# Patient Record
Sex: Female | Born: 1990 | Race: Black or African American | Hispanic: No | Marital: Single | Smoking: Smoker, current status unknown
Health system: Southern US, Community
[De-identification: ages and names within clinical notes are randomized; demographics above are authoritative.]

## PROBLEM LIST (undated history)

## (undated) DIAGNOSIS — Z4541 Encounter for adjustment and management of cerebrospinal fluid drainage device: Secondary | ICD-10-CM

## (undated) DIAGNOSIS — IMO0002 Reserved for concepts with insufficient information to code with codable children: Secondary | ICD-10-CM

## (undated) HISTORY — PX: VENTRICULAR ABLATION SURGERY: SHX835

---
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
## 2011-01-20 ENCOUNTER — Emergency Department (HOSPITAL_COMMUNITY)
Admission: EM | Admit: 2011-01-20 | Discharge: 2011-01-21 | Disposition: A | Discharge status: Home / Self Care | Payer: Medicaid Other | Attending: Emergency Medicine | Admitting: Emergency Medicine

## 2011-01-20 DIAGNOSIS — R51 Headache: Secondary | ICD-10-CM | POA: Insufficient documentation

## 2011-01-20 DIAGNOSIS — R110 Nausea: Secondary | ICD-10-CM | POA: Insufficient documentation

## 2011-02-08 LAB — URINALYSIS, ROUTINE W REFLEX MICROSCOPIC
Glucose, UA: NEGATIVE mg/dL
Hgb urine dipstick: NEGATIVE
Ketones, ur: NEGATIVE mg/dL
Protein, ur: NEGATIVE mg/dL

## 2011-02-08 LAB — WET PREP, GENITAL
Trich, Wet Prep: NONE SEEN
WBC, Wet Prep HPF POC: NONE SEEN

## 2011-02-10 LAB — COMPREHENSIVE METABOLIC PANEL
ALT: 16 U/L (ref 0–35)
AST: 25 U/L (ref 0–37)
Albumin: 3.7 g/dL (ref 3.5–5.2)
Alkaline Phosphatase: 51 U/L (ref 47–119)
BUN: 12 mg/dL (ref 6–23)
Chloride: 109 mEq/L (ref 96–112)
Potassium: 3.4 mEq/L — ABNORMAL LOW (ref 3.5–5.1)
Sodium: 137 mEq/L (ref 135–145)
Total Bilirubin: 0.5 mg/dL (ref 0.3–1.2)

## 2011-02-10 LAB — URINALYSIS, ROUTINE W REFLEX MICROSCOPIC
Bilirubin Urine: NEGATIVE
Bilirubin Urine: NEGATIVE
Ketones, ur: NEGATIVE mg/dL
Nitrite: NEGATIVE
Protein, ur: NEGATIVE mg/dL
Protein, ur: NEGATIVE mg/dL
Urobilinogen, UA: 0.2 mg/dL (ref 0.0–1.0)

## 2011-02-10 LAB — CBC
HCT: 37.2 % (ref 36.0–49.0)
MCV: 90.8 fL (ref 78.0–98.0)
Platelets: 239 10*3/uL (ref 150–400)
RBC: 4.09 MIL/uL (ref 3.80–5.70)
WBC: 9.1 10*3/uL (ref 4.5–13.5)

## 2011-02-10 LAB — DIFFERENTIAL
Basophils Absolute: 0 10*3/uL (ref 0.0–0.1)
Basophils Relative: 0 % (ref 0–1)
Eosinophils Absolute: 0.1 10*3/uL (ref 0.0–1.2)
Eosinophils Relative: 2 % (ref 0–5)
Monocytes Absolute: 0.6 10*3/uL (ref 0.2–1.2)
Neutro Abs: 6.1 10*3/uL (ref 1.7–8.0)

## 2011-02-10 LAB — PREGNANCY, URINE
Preg Test, Ur: NEGATIVE
Preg Test, Ur: NEGATIVE

## 2011-02-10 LAB — HERPES SIMPLEX VIRUS CULTURE: Culture: NOT DETECTED

## 2011-02-10 LAB — URINE MICROSCOPIC-ADD ON

## 2011-02-11 LAB — PREGNANCY, URINE: Preg Test, Ur: NEGATIVE

## 2011-02-12 LAB — URINALYSIS, ROUTINE W REFLEX MICROSCOPIC
Bilirubin Urine: NEGATIVE
Ketones, ur: NEGATIVE mg/dL
Nitrite: NEGATIVE
Specific Gravity, Urine: 1.02 (ref 1.005–1.030)
Urobilinogen, UA: 0.2 mg/dL (ref 0.0–1.0)
pH: 7.5 (ref 5.0–8.0)

## 2011-02-12 LAB — PREGNANCY, URINE: Preg Test, Ur: NEGATIVE

## 2011-02-14 LAB — DIFFERENTIAL
Basophils Relative: 0 % (ref 0–1)
Basophils Relative: 0 % (ref 0–1)
Eosinophils Relative: 0 % (ref 0–5)
Lymphocytes Relative: 16 % — ABNORMAL LOW (ref 24–48)
Lymphocytes Relative: 31 % (ref 24–48)
Lymphs Abs: 3 10*3/uL (ref 1.1–4.8)
Monocytes Absolute: 0.4 10*3/uL (ref 0.2–1.2)
Monocytes Absolute: 0.9 10*3/uL (ref 0.2–1.2)
Monocytes Relative: 10 % (ref 3–11)
Monocytes Relative: 4 % (ref 3–11)
Neutro Abs: 5.3 10*3/uL (ref 1.7–8.0)
Neutro Abs: 8 10*3/uL (ref 1.7–8.0)
Neutrophils Relative %: 55 % (ref 43–71)

## 2011-02-14 LAB — URINALYSIS, ROUTINE W REFLEX MICROSCOPIC
Bilirubin Urine: NEGATIVE
Glucose, UA: NEGATIVE mg/dL
Ketones, ur: NEGATIVE mg/dL
Protein, ur: NEGATIVE mg/dL
pH: 7.5 (ref 5.0–8.0)

## 2011-02-14 LAB — COMPREHENSIVE METABOLIC PANEL
Albumin: 3.4 g/dL — ABNORMAL LOW (ref 3.5–5.2)
Alkaline Phosphatase: 67 U/L (ref 47–119)
BUN: 12 mg/dL (ref 6–23)
Calcium: 9.1 mg/dL (ref 8.4–10.5)
Creatinine, Ser: 0.74 mg/dL (ref 0.4–1.2)
Glucose, Bld: 75 mg/dL (ref 70–99)
Total Protein: 6.8 g/dL (ref 6.0–8.3)

## 2011-02-14 LAB — CBC
HCT: 37.8 % (ref 36.0–49.0)
HCT: 40 % (ref 36.0–49.0)
Hemoglobin: 12.9 g/dL (ref 12.0–16.0)
Hemoglobin: 13.6 g/dL (ref 12.0–16.0)
MCHC: 34.1 g/dL (ref 31.0–37.0)
MCHC: 34.2 g/dL (ref 31.0–37.0)
Platelets: 229 10*3/uL (ref 150–400)
RBC: 4.47 MIL/uL (ref 3.80–5.70)
RDW: 13.3 % (ref 11.4–15.5)

## 2011-02-14 LAB — BASIC METABOLIC PANEL
CO2: 26 mEq/L (ref 19–32)
Calcium: 9.6 mg/dL (ref 8.4–10.5)
Glucose, Bld: 125 mg/dL — ABNORMAL HIGH (ref 70–99)
Potassium: 3.7 mEq/L (ref 3.5–5.1)
Sodium: 137 mEq/L (ref 135–145)

## 2011-02-14 LAB — PREGNANCY, URINE
Preg Test, Ur: NEGATIVE
Preg Test, Ur: NEGATIVE

## 2011-02-16 LAB — URINALYSIS, ROUTINE W REFLEX MICROSCOPIC
Bilirubin Urine: NEGATIVE
Glucose, UA: NEGATIVE mg/dL
Nitrite: NEGATIVE
Specific Gravity, Urine: 1.025 (ref 1.005–1.030)
pH: 7.5 (ref 5.0–8.0)

## 2011-02-16 LAB — PREGNANCY, URINE: Preg Test, Ur: NEGATIVE

## 2011-02-16 LAB — BASIC METABOLIC PANEL
Chloride: 105 mEq/L (ref 96–112)
Potassium: 3.7 mEq/L (ref 3.5–5.1)
Sodium: 138 mEq/L (ref 135–145)

## 2011-02-16 LAB — CBC
HCT: 39.4 % (ref 36.0–49.0)
Hemoglobin: 13.6 g/dL (ref 12.0–16.0)
MCV: 90.4 fL (ref 78.0–98.0)
RBC: 4.36 MIL/uL (ref 3.80–5.70)
WBC: 8.4 10*3/uL (ref 4.5–13.5)

## 2011-02-16 LAB — DIFFERENTIAL
Eosinophils Relative: 4 % (ref 0–5)
Lymphocytes Relative: 29 % (ref 24–48)
Lymphs Abs: 2.4 10*3/uL (ref 1.1–4.8)
Monocytes Absolute: 0.8 10*3/uL (ref 0.2–1.2)
Monocytes Relative: 10 % (ref 3–11)

## 2011-02-28 LAB — DIFFERENTIAL
Basophils Relative: 0 % (ref 0–1)
Eosinophils Absolute: 0.1 10*3/uL (ref 0.0–1.2)
Eosinophils Relative: 2 % (ref 0–5)
Monocytes Absolute: 0.6 10*3/uL (ref 0.2–1.2)
Monocytes Relative: 8 % (ref 3–11)

## 2011-02-28 LAB — CBC
Hemoglobin: 11.5 g/dL — ABNORMAL LOW (ref 12.0–16.0)
MCHC: 34.1 g/dL (ref 31.0–37.0)
MCV: 91.3 fL (ref 78.0–98.0)
RBC: 3.71 MIL/uL — ABNORMAL LOW (ref 3.80–5.70)

## 2011-02-28 LAB — BASIC METABOLIC PANEL
CO2: 27 mEq/L (ref 19–32)
Chloride: 112 mEq/L (ref 96–112)
Sodium: 143 mEq/L (ref 135–145)

## 2011-02-28 LAB — URINALYSIS, ROUTINE W REFLEX MICROSCOPIC
Bilirubin Urine: NEGATIVE
Glucose, UA: NEGATIVE mg/dL
Hgb urine dipstick: NEGATIVE
Protein, ur: NEGATIVE mg/dL
Specific Gravity, Urine: 1.015 (ref 1.005–1.030)

## 2011-02-28 LAB — RAPID URINE DRUG SCREEN, HOSP PERFORMED
Amphetamines: NOT DETECTED
Barbiturates: NOT DETECTED
Cocaine: NOT DETECTED
Opiates: NOT DETECTED

## 2011-03-01 LAB — GC/CHLAMYDIA PROBE AMP, GENITAL
Chlamydia, DNA Probe: NEGATIVE
GC Probe Amp, Genital: NEGATIVE

## 2011-03-01 LAB — HERPES SIMPLEX VIRUS CULTURE: Culture: DETECTED

## 2011-03-04 LAB — DIFFERENTIAL
Basophils Absolute: 0 10*3/uL (ref 0.0–0.1)
Basophils Relative: 0 % (ref 0–1)
Neutro Abs: 6.1 10*3/uL (ref 1.7–8.0)
Neutrophils Relative %: 74 % — ABNORMAL HIGH (ref 43–71)

## 2011-03-04 LAB — URINALYSIS, ROUTINE W REFLEX MICROSCOPIC
Glucose, UA: NEGATIVE mg/dL
Ketones, ur: 15 mg/dL — AB

## 2011-03-04 LAB — CBC
MCHC: 34.8 g/dL (ref 31.0–37.0)
Platelets: 235 10*3/uL (ref 150–400)
RBC: 4.09 MIL/uL (ref 3.80–5.70)
RDW: 14 % (ref 11.4–15.5)

## 2011-03-04 LAB — BASIC METABOLIC PANEL
BUN: 10 mg/dL (ref 6–23)
CO2: 27 mEq/L (ref 19–32)
Calcium: 9.3 mg/dL (ref 8.4–10.5)
Creatinine, Ser: 0.81 mg/dL (ref 0.4–1.2)

## 2011-03-04 LAB — GC/CHLAMYDIA PROBE AMP, GENITAL: GC Probe Amp, Genital: NEGATIVE

## 2011-03-04 LAB — URINE MICROSCOPIC-ADD ON

## 2011-03-06 LAB — PREGNANCY, URINE: Preg Test, Ur: NEGATIVE

## 2011-03-06 LAB — BASIC METABOLIC PANEL
BUN: 2 mg/dL — ABNORMAL LOW (ref 6–23)
BUN: 5 mg/dL — ABNORMAL LOW (ref 6–23)
Calcium: 8.7 mg/dL (ref 8.4–10.5)
Calcium: 9.2 mg/dL (ref 8.4–10.5)
Chloride: 106 mEq/L (ref 96–112)
Chloride: 109 mEq/L (ref 96–112)
Creatinine, Ser: 0.54 mg/dL (ref 0.4–1.2)
Creatinine, Ser: 0.65 mg/dL (ref 0.4–1.2)
Potassium: 3.5 mEq/L (ref 3.5–5.1)
Potassium: 3.9 mEq/L (ref 3.5–5.1)
Sodium: 135 mEq/L (ref 135–145)

## 2011-03-06 LAB — DIFFERENTIAL
Basophils Absolute: 0 10*3/uL (ref 0.0–0.1)
Basophils Relative: 0 % (ref 0–1)
Eosinophils Absolute: 0.1 10*3/uL (ref 0.0–1.2)
Eosinophils Relative: 0 % (ref 0–5)
Lymphocytes Relative: 15 % — ABNORMAL LOW (ref 24–48)
Lymphocytes Relative: 3 % — ABNORMAL LOW (ref 24–48)
Lymphs Abs: 0.3 10*3/uL — ABNORMAL LOW (ref 1.1–4.8)
Lymphs Abs: 1 10*3/uL — ABNORMAL LOW (ref 1.1–4.8)
Lymphs Abs: 1.6 10*3/uL (ref 1.1–4.8)
Monocytes Absolute: 0.1 10*3/uL — ABNORMAL LOW (ref 0.2–1.2)
Monocytes Relative: 13 % — ABNORMAL HIGH (ref 3–11)
Neutro Abs: 11.7 10*3/uL — ABNORMAL HIGH (ref 1.7–8.0)
Neutro Abs: 5.5 10*3/uL (ref 1.7–8.0)
Neutrophils Relative %: 69 % (ref 43–71)
Neutrophils Relative %: 72 % — ABNORMAL HIGH (ref 43–71)

## 2011-03-06 LAB — URINALYSIS, ROUTINE W REFLEX MICROSCOPIC
Bilirubin Urine: NEGATIVE
Glucose, UA: NEGATIVE mg/dL
Ketones, ur: NEGATIVE mg/dL
Leukocytes, UA: NEGATIVE
Nitrite: NEGATIVE
Protein, ur: 100 mg/dL — AB
Specific Gravity, Urine: 1.01 (ref 1.005–1.030)
Specific Gravity, Urine: 1.02 (ref 1.005–1.030)
Urobilinogen, UA: 0.2 mg/dL (ref 0.0–1.0)
pH: 5.5 (ref 5.0–8.0)

## 2011-03-06 LAB — CBC
HCT: 34.4 % — ABNORMAL LOW (ref 36.0–49.0)
Hemoglobin: 12.2 g/dL (ref 12.0–16.0)
MCV: 89 fL (ref 78.0–98.0)
Platelets: 197 10*3/uL (ref 150–400)
Platelets: 213 10*3/uL (ref 150–400)
RBC: 3.42 MIL/uL — ABNORMAL LOW (ref 3.80–5.70)
RDW: 13.2 % (ref 11.4–15.5)
WBC: 10.5 10*3/uL (ref 4.5–13.5)
WBC: 12.1 10*3/uL (ref 4.5–13.5)
WBC: 7.6 10*3/uL (ref 4.5–13.5)

## 2011-03-06 LAB — CULTURE, BLOOD (ROUTINE X 2)
Culture: NO GROWTH
Culture: NO GROWTH
Report Status: 5272010
Report Status: 5272010

## 2011-03-06 LAB — URINE MICROSCOPIC-ADD ON

## 2011-03-07 LAB — DIFFERENTIAL
Lymphocytes Relative: 14 % — ABNORMAL LOW (ref 24–48)
Monocytes Absolute: 0.5 10*3/uL (ref 0.2–1.2)
Monocytes Relative: 7 % (ref 3–11)
Neutro Abs: 5 10*3/uL (ref 1.7–8.0)

## 2011-03-07 LAB — URINALYSIS, ROUTINE W REFLEX MICROSCOPIC
Glucose, UA: NEGATIVE mg/dL
Specific Gravity, Urine: 1.025 (ref 1.005–1.030)
pH: 6 (ref 5.0–8.0)

## 2011-03-07 LAB — BASIC METABOLIC PANEL
CO2: 26 mEq/L (ref 19–32)
Calcium: 9.3 mg/dL (ref 8.4–10.5)
Sodium: 137 mEq/L (ref 135–145)

## 2011-03-07 LAB — URINE MICROSCOPIC-ADD ON

## 2011-03-07 LAB — CBC
Hemoglobin: 12.5 g/dL (ref 12.0–16.0)
RBC: 4.03 MIL/uL (ref 3.80–5.70)

## 2011-03-12 LAB — URINALYSIS, ROUTINE W REFLEX MICROSCOPIC
Bilirubin Urine: NEGATIVE
Ketones, ur: NEGATIVE mg/dL
Nitrite: NEGATIVE
Urobilinogen, UA: 0.2 mg/dL (ref 0.0–1.0)
pH: 5.5 (ref 5.0–8.0)

## 2011-03-12 LAB — CULTURE, BLOOD (ROUTINE X 2)

## 2011-03-12 LAB — CBC
Hemoglobin: 13.5 g/dL (ref 12.0–16.0)
Platelets: 320 10*3/uL (ref 150–400)
RDW: 12.9 % (ref 11.4–15.5)
WBC: 8.7 10*3/uL (ref 4.5–13.5)

## 2011-03-12 LAB — BASIC METABOLIC PANEL
BUN: 6 mg/dL (ref 6–23)
Calcium: 9.4 mg/dL (ref 8.4–10.5)
Chloride: 104 mEq/L (ref 96–112)
Glucose, Bld: 97 mg/dL (ref 70–99)

## 2011-03-12 LAB — PROTIME-INR: INR: 1 (ref 0.00–1.49)

## 2011-03-12 LAB — APTT: aPTT: 29 seconds (ref 24–37)

## 2011-03-12 LAB — DIFFERENTIAL
Basophils Absolute: 0.1 10*3/uL (ref 0.0–0.1)
Lymphocytes Relative: 27 % (ref 24–48)
Lymphs Abs: 2.3 10*3/uL (ref 1.1–4.8)
Neutro Abs: 5.3 10*3/uL (ref 1.7–8.0)

## 2011-03-12 LAB — PREGNANCY, URINE: Preg Test, Ur: NEGATIVE

## 2011-03-12 LAB — SEDIMENTATION RATE: Sed Rate: 10 mm/hr (ref 0–22)

## 2011-03-12 LAB — URINE CULTURE: Colony Count: 15000

## 2011-03-31 ENCOUNTER — Emergency Department (HOSPITAL_COMMUNITY)
Admission: EM | Admit: 2011-03-31 | Discharge: 2011-04-01 | Disposition: A | Discharge status: Home / Self Care | Payer: Medicaid Other | Attending: Emergency Medicine | Admitting: Emergency Medicine

## 2011-03-31 DIAGNOSIS — A6000 Herpesviral infection of urogenital system, unspecified: Secondary | ICD-10-CM | POA: Insufficient documentation

## 2011-03-31 DIAGNOSIS — R110 Nausea: Secondary | ICD-10-CM | POA: Insufficient documentation

## 2011-03-31 DIAGNOSIS — R51 Headache: Secondary | ICD-10-CM | POA: Insufficient documentation

## 2011-03-31 DIAGNOSIS — H53149 Visual discomfort, unspecified: Secondary | ICD-10-CM | POA: Insufficient documentation

## 2011-04-08 ENCOUNTER — Emergency Department (HOSPITAL_COMMUNITY)
Admission: EM | Admit: 2011-04-08 | Discharge: 2011-04-09 | Disposition: A | Discharge status: Home / Self Care | Payer: Medicaid Other | Attending: Emergency Medicine | Admitting: Emergency Medicine

## 2011-04-08 DIAGNOSIS — K5289 Other specified noninfective gastroenteritis and colitis: Secondary | ICD-10-CM | POA: Insufficient documentation

## 2011-04-08 DIAGNOSIS — R112 Nausea with vomiting, unspecified: Secondary | ICD-10-CM | POA: Insufficient documentation

## 2011-04-08 DIAGNOSIS — R109 Unspecified abdominal pain: Secondary | ICD-10-CM | POA: Insufficient documentation

## 2011-04-08 DIAGNOSIS — Z982 Presence of cerebrospinal fluid drainage device: Secondary | ICD-10-CM | POA: Insufficient documentation

## 2011-04-08 DIAGNOSIS — R3915 Urgency of urination: Secondary | ICD-10-CM | POA: Insufficient documentation

## 2011-04-08 DIAGNOSIS — F17200 Nicotine dependence, unspecified, uncomplicated: Secondary | ICD-10-CM | POA: Insufficient documentation

## 2011-04-08 LAB — DIFFERENTIAL
Basophils Absolute: 0 10*3/uL (ref 0.0–0.1)
Basophils Relative: 0 % (ref 0–1)
Eosinophils Absolute: 0.2 10*3/uL (ref 0.0–0.7)
Eosinophils Relative: 2 % (ref 0–5)
Monocytes Absolute: 0.7 10*3/uL (ref 0.1–1.0)

## 2011-04-08 LAB — CBC
HCT: 40.2 % (ref 36.0–46.0)
MCHC: 33.3 g/dL (ref 30.0–36.0)
Platelets: 237 10*3/uL (ref 150–400)
RDW: 13 % (ref 11.5–15.5)

## 2011-04-08 LAB — POCT PREGNANCY, URINE: Preg Test, Ur: NEGATIVE

## 2011-04-09 LAB — COMPREHENSIVE METABOLIC PANEL
AST: 11 U/L (ref 0–37)
CO2: 27 mEq/L (ref 19–32)
Calcium: 10.3 mg/dL (ref 8.4–10.5)
Creatinine, Ser: 0.68 mg/dL (ref 0.4–1.2)
GFR calc Af Amer: >60 mL/min (ref >60)
GFR calc non Af Amer: >60 mL/min (ref >60)

## 2011-04-09 LAB — WET PREP, GENITAL
Clue Cells Wet Prep HPF POC: NONE SEEN
Trich, Wet Prep: NONE SEEN

## 2011-04-09 LAB — URINALYSIS, ROUTINE W REFLEX MICROSCOPIC
Hgb urine dipstick: NEGATIVE
Nitrite: NEGATIVE
Protein, ur: NEGATIVE mg/dL
Urobilinogen, UA: 0.2 mg/dL (ref 0.0–1.0)

## 2011-04-09 LAB — LIPASE, BLOOD: Lipase: 29 U/L (ref 11–59)

## 2011-04-10 LAB — GC/CHLAMYDIA PROBE AMP, GENITAL: GC Probe Amp, Genital: NEGATIVE

## 2011-04-10 NOTE — H&P (Signed)
[name], [name]           ACCOUNT NO.:  [id number]   MEDICAL RECORD NO.:  [id number]          PATIENT TYPE:  INP   LOCATION:  A321                          FACILITY:  [facility]   PHYSICIAN:  Scott A. Gerda Diss, MD    DATE OF BIRTH:  01-25-1991   DATE OF ADMISSION:  04/16/2009  DATE OF DISCHARGE:  05/25/2010LH                              HISTORY & PHYSICAL   CHIEF COMPLAINT:  Fever and abdominal discomfort.   HISTORY OF PRESENT ILLNESS:  This 20 year old female relates some  intermittent abdominal pain over the past few days, also relates some  back pain and discomfort.  It should be noted that she denies any severe  headache.  She states that she started noticing the back pain a few days  ago and it has become worse and then today she has noticed chills and  fever, along with abdominal discomfort and a couple episodes of  vomiting.  She denies any cough.  She denies any diarrhea.  She states  her overall energy level is fair and it should be noted that she had a  shunt revision last month at [facility].   PAST MEDICAL HISTORY:  The patient states she has had a shunt ever since  birth.   SOCIAL HISTORY:  Smokes.  Does use of marijuana, also birth control  pills.   ALLERGIES:  None.   REVIEW OF SYSTEMS:  Per above.   PHYSICAL EXAMINATION:  GENERAL:  NAD.  HEENT:  TMs,  L, T, and MM.  NECK:  Supple.  CHEST:  CTA.  No crackles.  HEART:  Regular.  ABDOMEN:  Soft with generalized soreness, but on no guarding and a  little more tenderness in the lower abdomen and on the back.  She is  tender in the right flank region.   LABORATORY DATA:  It should be noted that her white count is at 12,000  with a left shift.  Also noted that potassium is 3.5.  Urinalysis shows  1.020 with large amount hemoglobin and large amount of leukocyte  esterase and on microscopic wbc's TNTC, rbc's TNTC.  Urine pregnancy  negative.   ASSESSMENT AND PLAN:  Pyelonephritis - I really do not feel that this  is  an infection of her shunt.  I think this is a urinary tract infection  has gone into of pyelonephritis.  I would recommend the Rocephin on a  regular basis 1 g  daily,  also pain medicine and nausea medicine as necessary IV fluids,  monitor closely with her neck being supple and no severe headache, it is  really doubtful this is meningitis or shunt problems.  We will do a  renal ultrasound on Monday and follow the patient closely.      Scott A. Gerda Diss, MD  Electronically Signed     SAL/MEDQ  D:  04/16/2009  T:  04/17/2009  Job:  [id number]

## 2011-04-10 NOTE — Discharge Summary (Signed)
NAMECAYLAN, CHENARD           ACCOUNT NO.:  [id number]   MEDICAL RECORD NO.:  [id number]          PATIENT TYPE:  INP   LOCATION:  A321                          FACILITY:  [facility]   PHYSICIAN:  Scott A. Gerda Diss, MD    DATE OF BIRTH:  1991/02/10   DATE OF ADMISSION:  04/16/2009  DATE OF DISCHARGE:  05/25/2010LH                               DISCHARGE SUMMARY   DISCHARGE DIAGNOSES:  1. Pyelonephritis.  2. Headache.  3. Possible shunt dysfunction.   HOSPITAL COURSE:  The patient was admitted in after having a few days  history of feeling bad, headache, nausea, abdominal pains and back pain.  She denied any severe headache and denied any excessive urination.  She  states energy level fair and had a shunt revision last month at [facility].  She denies any fevers.  On exam, she had flank tenderness.  Neck was  supple.  Lungs were clear.  Urine showed a large number of WBCs and  RBCs.  Unfortunately, urine culture was not done as thought.  A renal  ultrasound was negative for an abscess.  A CBC repeat showed a white  count of 7.6.  Repeat urinalysis on the Apr 18, 2009, showed 0-2 WBCs  per [facility] and a renal ultrasound was negative.  Because the patient  continued to have headache, back pain and not feeling good and even some  nausea and occasional vomiting, it is felt possibility of shunt problems  as being another source since it appears clinically that the urine  problem has improved, so I did speak with Dr. Caralyn Guile, who spoke  with the neurosurgeon and they agreed to take the patient to [facility]  for further evaluation of the shunt and so the patient was transferred  in good condition.      Scott A. Gerda Diss, MD  Electronically Signed     SAL/MEDQ  D:  04/19/2009  T:  04/19/2009  Job:  [id number]

## 2011-04-14 ENCOUNTER — Emergency Department (HOSPITAL_COMMUNITY): Payer: Medicaid Other

## 2011-04-14 ENCOUNTER — Emergency Department (HOSPITAL_COMMUNITY)
Admission: EM | Admit: 2011-04-14 | Discharge: 2011-04-15 | Disposition: A | Discharge status: Home / Self Care | Payer: Medicaid Other | Attending: Emergency Medicine | Admitting: Emergency Medicine

## 2011-04-14 DIAGNOSIS — R51 Headache: Secondary | ICD-10-CM | POA: Insufficient documentation

## 2011-04-14 DIAGNOSIS — S0510XA Contusion of eyeball and orbital tissues, unspecified eye, initial encounter: Secondary | ICD-10-CM | POA: Insufficient documentation

## 2011-04-14 DIAGNOSIS — M542 Cervicalgia: Secondary | ICD-10-CM | POA: Insufficient documentation

## 2011-04-14 DIAGNOSIS — H5789 Other specified disorders of eye and adnexa: Secondary | ICD-10-CM | POA: Insufficient documentation

## 2011-04-14 DIAGNOSIS — M549 Dorsalgia, unspecified: Secondary | ICD-10-CM | POA: Insufficient documentation

## 2011-04-14 DIAGNOSIS — S1093XA Contusion of unspecified part of neck, initial encounter: Secondary | ICD-10-CM | POA: Insufficient documentation

## 2011-04-14 DIAGNOSIS — S0003XA Contusion of scalp, initial encounter: Secondary | ICD-10-CM | POA: Insufficient documentation

## 2011-04-30 ENCOUNTER — Emergency Department (HOSPITAL_COMMUNITY)
Admission: EM | Admit: 2011-04-30 | Discharge: 2011-04-30 | Disposition: A | Discharge status: Home / Self Care | Payer: Medicaid Other | Attending: Emergency Medicine | Admitting: Emergency Medicine

## 2011-04-30 DIAGNOSIS — N898 Other specified noninflammatory disorders of vagina: Secondary | ICD-10-CM | POA: Insufficient documentation

## 2011-04-30 DIAGNOSIS — F17200 Nicotine dependence, unspecified, uncomplicated: Secondary | ICD-10-CM | POA: Insufficient documentation

## 2011-04-30 LAB — CBC
HCT: 38.9 % (ref 36.0–46.0)
MCH: 30 pg (ref 26.0–34.0)
MCHC: 32.9 g/dL (ref 30.0–36.0)
MCV: 91.3 fL (ref 78.0–100.0)
RDW: 13 % (ref 11.5–15.5)

## 2011-04-30 LAB — URINALYSIS, ROUTINE W REFLEX MICROSCOPIC
Nitrite: NEGATIVE
Specific Gravity, Urine: 1.015 (ref 1.005–1.030)
Urobilinogen, UA: 1 mg/dL (ref 0.0–1.0)

## 2011-04-30 LAB — DIFFERENTIAL
Eosinophils Relative: 3 % (ref 0–5)
Lymphocytes Relative: 19 % (ref 12–46)
Lymphs Abs: 2 10*3/uL (ref 0.7–4.0)
Monocytes Absolute: 0.9 10*3/uL (ref 0.1–1.0)

## 2011-04-30 LAB — WET PREP, GENITAL: WBC, Wet Prep HPF POC: NONE SEEN

## 2011-04-30 LAB — POCT PREGNANCY, URINE: Preg Test, Ur: NEGATIVE

## 2011-04-30 LAB — URINE MICROSCOPIC-ADD ON

## 2011-05-01 LAB — BASIC METABOLIC PANEL
CO2: 27 mEq/L (ref 19–32)
Chloride: 103 mEq/L (ref 96–112)
Creatinine, Ser: 0.76 mg/dL (ref 0.4–1.2)
GFR calc Af Amer: >60 mL/min (ref >60)
Potassium: 3.7 mEq/L (ref 3.5–5.1)

## 2011-05-02 LAB — GC/CHLAMYDIA PROBE AMP, GENITAL
Chlamydia, DNA Probe: NEGATIVE
GC Probe Amp, Genital: NEGATIVE

## 2011-05-07 IMAGING — CR DG CHEST 2V
2 series · 2 of 2 positions shown · non-contrast
Comparison: 03/04/2009

CLINICAL DATA: Cough and congestion

CHEST - 2 VIEW

[view not recorded (1 of 2)]
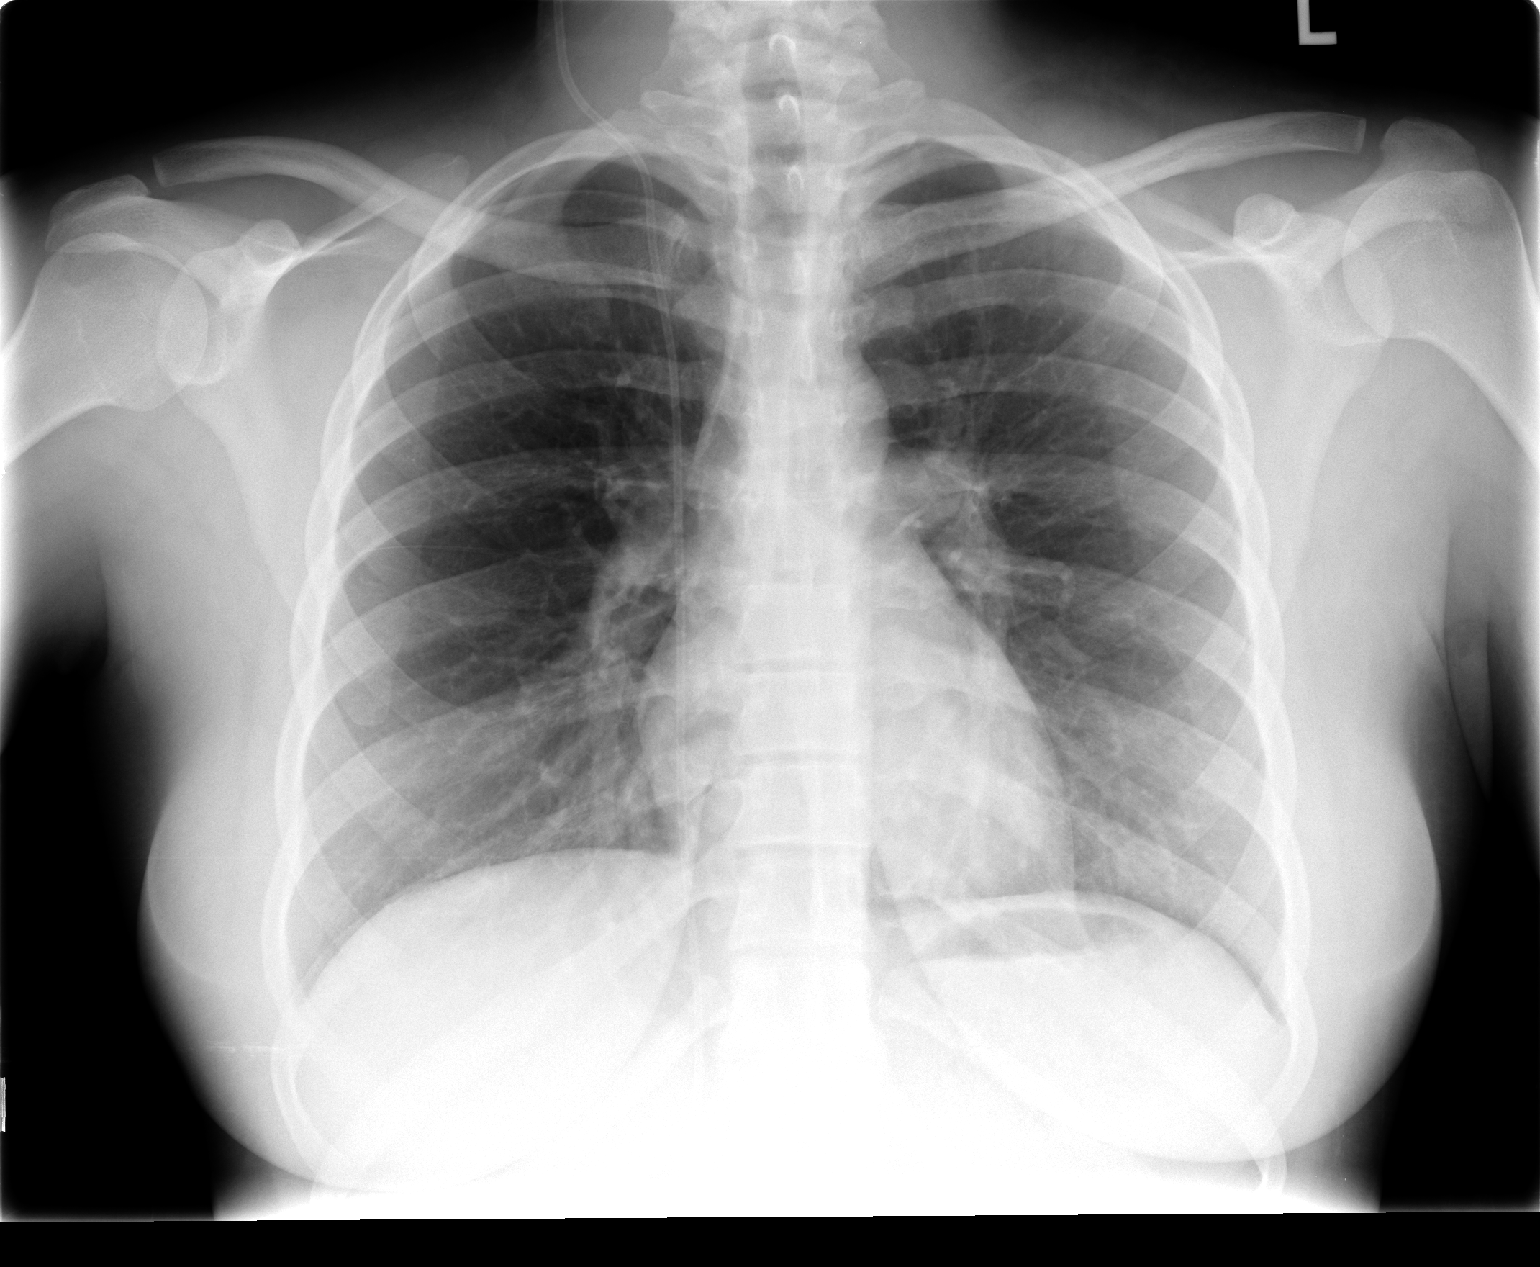

[view not recorded (2 of 2)]
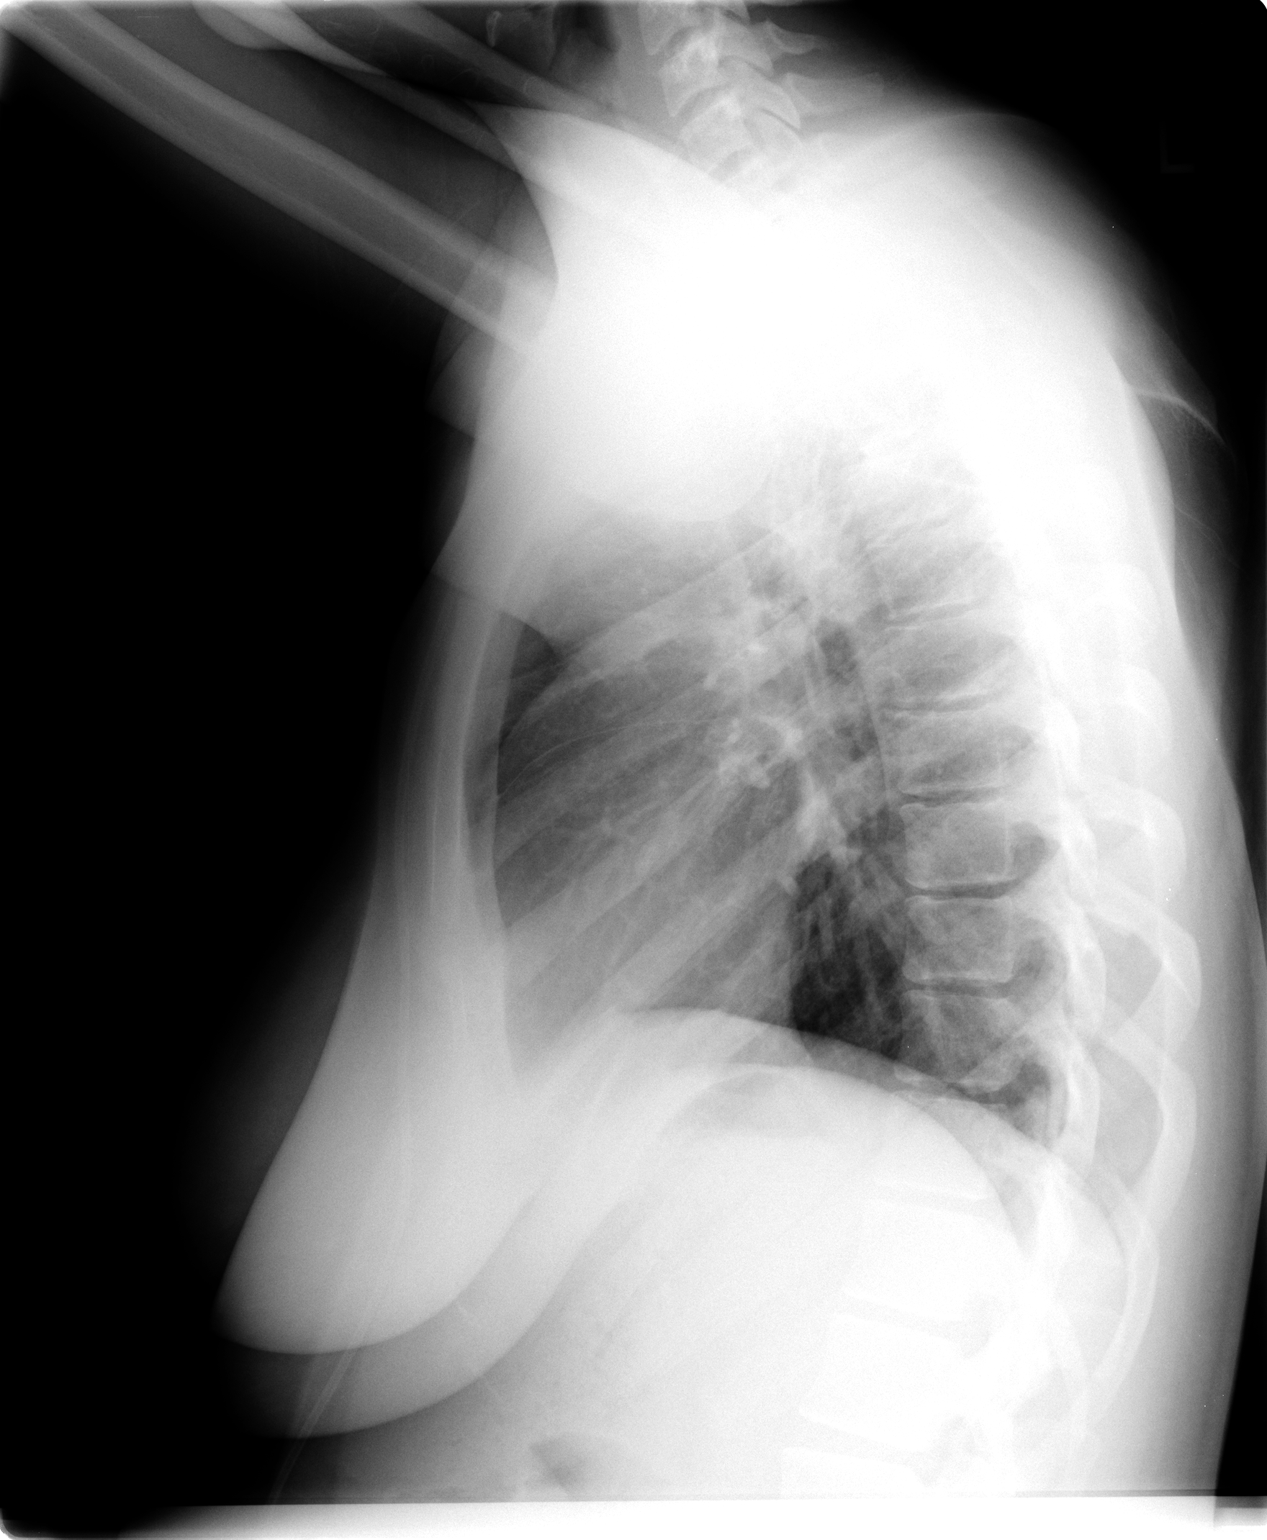

[2 of 2 positions shown; findings below may reference images not displayed]

FINDINGS: VP shunt tubing is partially seen over the right
hemithorax. Lungs clear.  Heart size and pulmonary vascularity
normal.  No effusion.  Visualized bones unremarkable.
IMPRESSION: No acute disease

## 2011-06-08 ENCOUNTER — Emergency Department (HOSPITAL_COMMUNITY)
Admission: EM | Admit: 2011-06-08 | Discharge: 2011-06-08 | Disposition: A | Discharge status: Home / Self Care | Payer: Medicaid Other | Attending: Emergency Medicine | Admitting: Emergency Medicine

## 2011-06-08 ENCOUNTER — Encounter: Payer: Self-pay | Admitting: Emergency Medicine

## 2011-06-08 DIAGNOSIS — R21 Rash and other nonspecific skin eruption: Secondary | ICD-10-CM

## 2011-06-08 DIAGNOSIS — F17200 Nicotine dependence, unspecified, uncomplicated: Secondary | ICD-10-CM | POA: Insufficient documentation

## 2011-06-08 HISTORY — DX: Reserved for concepts with insufficient information to code with codable children: IMO0002

## 2011-06-08 MED ORDER — HYDROXYZINE PAMOATE 100 MG PO CAPS: ORAL_CAPSULE | ORAL | Status: DC

## 2011-06-08 MED ORDER — TRIAMCINOLONE ACETONIDE 0.1 % EX CREA: TOPICAL_CREAM | Freq: Three times a day (TID) | CUTANEOUS | Status: AC

## 2011-06-08 MED ORDER — FEXOFENADINE HCL 180 MG PO TABS: ORAL_TABLET | ORAL | Status: AC

## 2011-06-08 NOTE — ED Provider Notes (Signed)
History     Chief Complaint  Patient presents with  . Rash   Patient is a 20 y.o. female presenting with rash. The history is provided by the patient.  Rash  This is a new problem. The current episode started more than 2 days ago. The problem has been gradually worsening. The problem is associated with an unknown factor. There has been no fever. The rash is present on the left arm. The patient is experiencing no pain. She has tried anti-itch cream for the symptoms. The treatment provided no relief.    Past Medical History  Diagnosis Date  . Hx of ventricular shunt     Past Surgical History  Procedure Date  . Ventricular ablation surgery     History reviewed. No pertinent family history.  History  Substance Use Topics  . Smoking status: Current Everyday Smoker    Types: Cigarettes  . Smokeless tobacco: Not on file  . Alcohol Use: Yes     occasional    OB History    Grav Para Term Preterm Abortions TAB SAB Ect Mult Living                  Review of Systems  Constitutional: Negative for activity change.       All ROS Neg except as noted in HPI  HENT: Negative for nosebleeds and neck pain.   Eyes: Negative for photophobia and discharge.  Respiratory: Negative for cough, shortness of breath and wheezing.   Cardiovascular: Negative for chest pain and palpitations.  Gastrointestinal: Negative for abdominal pain and blood in stool.  Genitourinary: Negative for dysuria, frequency and hematuria.  Musculoskeletal: Negative for back pain and arthralgias.  Skin: Positive for rash.  Neurological: Negative for dizziness, seizures and speech difficulty.  Psychiatric/Behavioral: Negative for hallucinations and confusion.    Physical Exam  BP 106/66  Pulse 81  Temp(Src) 98.9 F (37.2 C) (Oral)  Resp 19  SpO2 99%  LMP 04/27/2011  Physical Exam  Nursing note and vitals reviewed. Constitutional: She is oriented to person, place, and time. She appears well-developed and  well-nourished.  HENT:  Head: Normocephalic and atraumatic.  Eyes: EOM are normal. Pupils are equal, round, and reactive to light.  Neck: Normal range of motion. Neck supple.  Cardiovascular: Normal rate and normal heart sounds.   Pulmonary/Chest: Breath sounds normal. She has no wheezes.  Abdominal: Soft. Bowel sounds are normal.  Musculoskeletal: Normal range of motion.       6 raised red bumps of the lateral bicep/tricep area of the left arm. Some increase redness, but no streaking.  Neurological: She is alert and oriented to person, place, and time. No cranial nerve deficit.  Skin: Skin is warm and dry. Rash noted.    ED Course  Procedures  MDM I have reviewed nursing notes, vital signs, and all appropriate lab and imaging results for this patient.      Kathie Dike, [place] 06/08/11 [phone]

## 2011-06-08 NOTE — ED Notes (Signed)
L lateral arm rash with dime size circular red bumps noticed. C/o itching and burning. States bumps have gotten bigger since yeseterday. States she is a week late on her periods.

## 2011-06-08 NOTE — ED Notes (Signed)
Patient is resting comfortably. 

## 2011-06-12 NOTE — ED Provider Notes (Signed)
Medical screening examination/treatment/procedure(s) were conducted as a shared visit with non-physician practitioner(s) and myself.  I personally evaluated the patient during the encounter  [name] Dawn Cebastian Neis, MD 06/12/11 0759 

## 2011-07-08 ENCOUNTER — Emergency Department (HOSPITAL_COMMUNITY): Payer: Medicaid Other

## 2011-07-08 ENCOUNTER — Emergency Department (HOSPITAL_COMMUNITY)
Admission: EM | Admit: 2011-07-08 | Discharge: 2011-07-08 | Disposition: A | Discharge status: Home / Self Care | Payer: Medicaid Other | Attending: Emergency Medicine | Admitting: Emergency Medicine

## 2011-07-08 DIAGNOSIS — S0003XA Contusion of scalp, initial encounter: Secondary | ICD-10-CM | POA: Insufficient documentation

## 2011-07-08 DIAGNOSIS — IMO0002 Reserved for concepts with insufficient information to code with codable children: Secondary | ICD-10-CM | POA: Insufficient documentation

## 2011-07-08 DIAGNOSIS — R079 Chest pain, unspecified: Secondary | ICD-10-CM | POA: Insufficient documentation

## 2011-07-08 DIAGNOSIS — Y9229 Other specified public building as the place of occurrence of the external cause: Secondary | ICD-10-CM | POA: Insufficient documentation

## 2011-07-08 DIAGNOSIS — S0510XA Contusion of eyeball and orbital tissues, unspecified eye, initial encounter: Secondary | ICD-10-CM | POA: Insufficient documentation

## 2011-07-08 DIAGNOSIS — R51 Headache: Secondary | ICD-10-CM | POA: Insufficient documentation

## 2011-07-08 DIAGNOSIS — M549 Dorsalgia, unspecified: Secondary | ICD-10-CM | POA: Insufficient documentation

## 2011-07-08 DIAGNOSIS — N83209 Unspecified ovarian cyst, unspecified side: Secondary | ICD-10-CM | POA: Insufficient documentation

## 2011-07-08 DIAGNOSIS — R109 Unspecified abdominal pain: Secondary | ICD-10-CM | POA: Insufficient documentation

## 2011-07-08 DIAGNOSIS — R10819 Abdominal tenderness, unspecified site: Secondary | ICD-10-CM | POA: Insufficient documentation

## 2011-07-08 DIAGNOSIS — M542 Cervicalgia: Secondary | ICD-10-CM | POA: Insufficient documentation

## 2011-07-08 MED ORDER — IOHEXOL 300 MG/ML  SOLN
100.0000 mL | Freq: Once | INTRAMUSCULAR | Status: AC | PRN
  Administered 2011-07-08: 100 mL via INTRAVENOUS

## 2011-07-12 ENCOUNTER — Encounter: Payer: Self-pay | Admitting: *Deleted

## 2011-07-12 ENCOUNTER — Emergency Department (HOSPITAL_COMMUNITY)
Admission: EM | Admit: 2011-07-12 | Discharge: 2011-07-12 | Disposition: A | Discharge status: Home / Self Care | Payer: Self-pay | Attending: Emergency Medicine | Admitting: Emergency Medicine

## 2011-07-12 DIAGNOSIS — S39012A Strain of muscle, fascia and tendon of lower back, initial encounter: Secondary | ICD-10-CM

## 2011-07-12 DIAGNOSIS — R109 Unspecified abdominal pain: Secondary | ICD-10-CM | POA: Insufficient documentation

## 2011-07-12 DIAGNOSIS — S335XXA Sprain of ligaments of lumbar spine, initial encounter: Secondary | ICD-10-CM | POA: Insufficient documentation

## 2011-07-12 HISTORY — DX: Encounter for adjustment and management of cerebrospinal fluid drainage device: Z45.41

## 2011-07-12 MED ORDER — HYDROCODONE-ACETAMINOPHEN 5-325 MG PO TABS: 1.0000 | ORAL_TABLET | Freq: Four times a day (QID) | ORAL | Status: AC | PRN

## 2011-07-12 MED ORDER — OXYCODONE-ACETAMINOPHEN 5-325 MG PO TABS
1.0000 | ORAL_TABLET | Freq: Once | ORAL | Status: AC
  Administered 2011-07-12: 1 via ORAL
  Filled 2011-07-12: qty 1

## 2011-07-12 NOTE — ED Notes (Signed)
Pt taken out via wheel chair to her ride, pt stating no needs
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
## 2011-08-08 ENCOUNTER — Encounter: Payer: Self-pay | Admitting: Emergency Medicine

## 2011-08-21 LAB — STREP A DNA PROBE: Group A Strep Probe: NEGATIVE

## 2011-08-23 LAB — STREP A DNA PROBE

## 2011-08-27 LAB — POCT I-STAT, CHEM 8
Calcium, Ion: 1.25
Creatinine, Ser: 0.8
Glucose, Bld: 103 — ABNORMAL HIGH
HCT: 35
Hemoglobin: 11.9
Potassium: 3.5
TCO2: 25

## 2011-08-27 LAB — URINALYSIS, ROUTINE W REFLEX MICROSCOPIC
Nitrite: NEGATIVE
Specific Gravity, Urine: 1.015
Urobilinogen, UA: 2 — ABNORMAL HIGH
pH: 7.5

## 2011-08-27 LAB — URINE MICROSCOPIC-ADD ON

## 2011-08-27 LAB — STREP A DNA PROBE

## 2011-08-27 LAB — PREGNANCY, URINE: Preg Test, Ur: NEGATIVE

## 2014-11-14 ENCOUNTER — Encounter (HOSPITAL_COMMUNITY): Payer: Self-pay | Admitting: *Deleted

## 2014-11-14 ENCOUNTER — Emergency Department (HOSPITAL_COMMUNITY): Payer: Medicaid Other

## 2014-11-14 ENCOUNTER — Emergency Department (HOSPITAL_COMMUNITY)
Admission: EM | Admit: 2014-11-14 | Discharge: 2014-11-14 | Disposition: A | Discharge status: Home / Self Care | Payer: Medicaid Other | Attending: Emergency Medicine | Admitting: Emergency Medicine

## 2014-11-14 DIAGNOSIS — Y939 Activity, unspecified: Secondary | ICD-10-CM | POA: Insufficient documentation

## 2014-11-14 DIAGNOSIS — Z3202 Encounter for pregnancy test, result negative: Secondary | ICD-10-CM | POA: Insufficient documentation

## 2014-11-14 DIAGNOSIS — X58XXXA Exposure to other specified factors, initial encounter: Secondary | ICD-10-CM | POA: Insufficient documentation

## 2014-11-14 DIAGNOSIS — Z982 Presence of cerebrospinal fluid drainage device: Secondary | ICD-10-CM | POA: Insufficient documentation

## 2014-11-14 DIAGNOSIS — R51 Headache: Secondary | ICD-10-CM | POA: Insufficient documentation

## 2014-11-14 DIAGNOSIS — S93401A Sprain of unspecified ligament of right ankle, initial encounter: Secondary | ICD-10-CM

## 2014-11-14 DIAGNOSIS — Y929 Unspecified place or not applicable: Secondary | ICD-10-CM | POA: Insufficient documentation

## 2014-11-14 DIAGNOSIS — L509 Urticaria, unspecified: Secondary | ICD-10-CM | POA: Insufficient documentation

## 2014-11-14 DIAGNOSIS — M25571 Pain in right ankle and joints of right foot: Secondary | ICD-10-CM

## 2014-11-14 DIAGNOSIS — Y999 Unspecified external cause status: Secondary | ICD-10-CM | POA: Insufficient documentation

## 2014-11-14 LAB — URINALYSIS, ROUTINE W REFLEX MICROSCOPIC
GLUCOSE, UA: NEGATIVE mg/dL
HGB URINE DIPSTICK: NEGATIVE
Leukocytes, UA: NEGATIVE
Nitrite: NEGATIVE
PROTEIN: NEGATIVE mg/dL
UROBILINOGEN UA: 0.2 mg/dL (ref 0.0–1.0)
pH: 6 (ref 5.0–8.0)

## 2014-11-14 MED ORDER — HYDROXYZINE HCL 25 MG PO TABS: 25.0000 mg | ORAL_TABLET | Freq: Four times a day (QID) | ORAL | Status: DC

## 2014-11-14 MED ORDER — HYDROXYZINE HCL 50 MG/ML IM SOLN
50.0000 mg | Freq: Once | INTRAMUSCULAR | Status: AC
  Administered 2014-11-14: 50 mg via INTRAMUSCULAR
  Filled 2014-11-14: qty 1

## 2014-11-14 MED ORDER — FAMOTIDINE 20 MG PO TABS
20.0000 mg | ORAL_TABLET | Freq: Once | ORAL | Status: AC
  Administered 2014-11-14: 20 mg via ORAL
  Filled 2014-11-14: qty 1

## 2014-11-14 MED ORDER — PREDNISONE 50 MG PO TABS
60.0000 mg | ORAL_TABLET | Freq: Once | ORAL | Status: AC
  Administered 2014-11-14: 60 mg via ORAL
  Filled 2014-11-14 (×2): qty 1

## 2014-11-14 MED ORDER — HYDROXYZINE HCL 50 MG/ML IM SOLN
50.0000 mg | Freq: Once | INTRAMUSCULAR | Status: DC
  Filled 2014-11-14: qty 1

## 2014-11-14 MED ORDER — DIPHENHYDRAMINE HCL 25 MG PO CAPS
25.0000 mg | ORAL_CAPSULE | Freq: Once | ORAL | Status: AC
  Administered 2014-11-14: 25 mg via ORAL
  Filled 2014-11-14: qty 1

## 2014-11-14 NOTE — Discharge Instructions (Signed)
Ankle Sprain An ankle sprain is an injury to the strong, fibrous tissues (ligaments) that hold your ankle bones together.  HOME CARE   Put ice on your ankle for 1-2 days or as told by your doctor.  Put ice in a plastic bag.  Place a towel between your skin and the bag.  Leave the ice on for 15-20 minutes at a time, every 2 hours while you are awake.  Only take medicine as told by your doctor.  Raise (elevate) your injured ankle above the level of your heart as much as possible for 2-3 days.  Use crutches if your doctor tells you to. Slowly put your own weight on the affected ankle. Use the crutches until you can walk without pain.  If you have a plaster splint:  Do not rest it on anything harder than a pillow for 24 hours.  Do not put weight on it.  Do not get it wet.  Take it off to shower or bathe.  If given, use an elastic wrap or support stocking for support. Take the wrap off if your toes lose feeling (numb), tingle, or turn cold or blue.  If you have an air splint:  Add or let out air to make it comfortable.  Take it off at night and to shower and bathe.  Wiggle your toes and move your ankle up and down often while you are wearing it. GET HELP IF:  You have rapidly increasing bruising or puffiness (swelling).  Your toes feel very cold.  You lose feeling in your foot.  Your medicine does not help your pain. GET HELP RIGHT AWAY IF:   Your toes lose feeling (numb) or turn blue.  You have severe pain that is increasing. MAKE SURE YOU:   Understand these instructions.  Will watch your condition.  Will get help right away if you are not doing well or get worse. Document Released: 04/30/2008 Document Revised: 03/29/2014 Document Reviewed: 05/26/2012 [facility] Patient Information 2015 [place], [place]. This information is not intended to replace advice given to you by your health care provider. Make sure you discuss any questions you have with your health care  provider.  Hives Hives are itchy, red, swollen areas of the skin. They can vary in size and location on your body. Hives can come and go for hours or several days (acute hives) or for several weeks (chronic hives). Hives do not spread from person to person (noncontagious). They may get worse with scratching, exercise, and emotional stress. CAUSES   Allergic reaction to food, additives, or drugs.  Infections, including the common cold.  Illness, such as vasculitis, lupus, or thyroid disease.  Exposure to sunlight, heat, or cold.  Exercise.  Stress.  Contact with chemicals. SYMPTOMS   Red or white swollen patches on the skin. The patches may change size, shape, and location quickly and repeatedly.  Itching.  Swelling of the hands, feet, and face. This may occur if hives develop deeper in the skin. DIAGNOSIS  Your caregiver can usually tell what is wrong by performing a physical exam. Skin or blood tests may also be done to determine the cause of your hives. In some cases, the cause cannot be determined. TREATMENT  Mild cases usually get better with medicines such as antihistamines. Severe cases may require an emergency epinephrine injection. If the cause of your hives is known, treatment includes avoiding that trigger.  HOME CARE INSTRUCTIONS   Avoid causes that trigger your hives.  Take antihistamines as  directed by your caregiver to reduce the severity of your hives. Non-sedating or low-sedating antihistamines are usually recommended. Do not drive while taking an antihistamine.  Take any other medicines prescribed for itching as directed by your caregiver.  Wear loose-fitting clothing.  Keep all follow-up appointments as directed by your caregiver. SEEK MEDICAL CARE IF:   You have persistent or severe itching that is not relieved with medicine.  You have painful or swollen joints. SEEK IMMEDIATE MEDICAL CARE IF:   You have a fever.  Your tongue or lips are  swollen.  You have trouble breathing or swallowing.  You feel tightness in the throat or chest.  You have abdominal pain. These problems may be the first sign of a life-threatening allergic reaction. Call your local emergency services (911 in [place] MAKE SURE YOU:   Understand these instructions.  Will watch your condition.  Will get help right away if you are not doing well or get worse. Document Released: 11/12/2005 Document Revised: 11/17/2013 Document Reviewed: 02/05/2012 [facility] CenterExitCare Patient Information 2015 [place]Care, [place]LLC. This information is not intended to replace advice given to you by your health care provider. Make sure you discuss any questions you have with your health care provider.

## 2014-11-14 NOTE — ED Provider Notes (Signed)
CSN: [id number]     Arrival date & time 11/14/14  [id number] History   First MD Initiated Contact with Patient 11/14/14 [phone]     Chief Complaint  Patient presents with  . multiple complaints.      (Consider location/radiation/quality/duration/timing/severity/associated sxs/prior Treatment) HPI Comments: Patient is a 23 year old female who presents to the emergency department with complaint of right ankle pain. The patient states that this is been going on for 2 days. She has pain that is worse when she stands or walks. The pain eases only slightly when she is at rest. She has tried Tylenol, without success. She's not had any previous operations or procedures involving the right ankle. She has no known bone disorders. His been no injury or trauma reported.  Patient states that she noted this morning that her eyes were swollen shut and she had red splotches on her arms that were itching a lot. Later during the morning she noted something on the upper portion of her buttocks in the lower portion of her back and had her boyfriend to check it and he to acknowledge that there were red area similar to the ones on her arms present. The patient denies any new medicines, foods, or environment. His been no recent change in trying sheets or detergents or clothing or linens. Patient presents to the emergency department for evaluation of these issues.  The history is provided by the patient.    Past Medical History  Diagnosis Date  . Hx of ventricular shunt   . Cerebral ventricular shunt fitting or adjustment    Past Surgical History  Procedure Laterality Date  . Ventricular ablation surgery     No family history on file. History  Substance Use Topics  . Smoking status: Smoker, Current Status Unknown    Types: Cigarettes  . Smokeless tobacco: Not on file  . Alcohol Use: No     Comment: occasional   OB History    No data available     Review of Systems  Constitutional: Negative for activity  change.       All ROS Neg except as noted in HPI  Eyes: Negative for photophobia and discharge.  Respiratory: Negative for cough, shortness of breath and wheezing.   Cardiovascular: Negative for chest pain and palpitations.  Gastrointestinal: Negative for abdominal pain and blood in stool.  Genitourinary: Negative for dysuria, frequency and hematuria.  Musculoskeletal: Positive for arthralgias. Negative for back pain and neck pain.  Skin: Positive for rash.  Neurological: Positive for headaches. Negative for dizziness, seizures and speech difficulty.  Psychiatric/Behavioral: Negative for hallucinations and confusion.      Allergies  Bee venom  Home Medications   Prior to Admission medications   Medication Sig Start Date End Date Taking? Authorizing Provider  fexofenadine (ALLEGRA) 180 MG tablet 1 tab po q AM for itching 06/08/11   Kathie DikeHobson M Shamari Lofquist, PA-C  hydrOXYzine (VISTARIL) 100 MG capsule 1 at hs for itching. 06/08/11   Kathie DikeHobson M Romero Letizia, PA-C  ibuprofen (ADVIL,MOTRIN) 800 MG tablet Take 800 mg by mouth every 6 (six) hours as needed. For pain     Historical Provider, MD   BP 109/69 mmHg  Temp(Src) 97.8 F (36.6 C) (Oral)  Resp 16  Ht 5\' 1"  (1.549 m)  Wt 150 lb (68.04 kg)  BMI 28.36 kg/m2  SpO2 87%  LMP 11/07/2014 Physical Exam  Constitutional: She is oriented to person, place, and time. She appears well-developed and well-nourished.  Non-toxic appearance.  HENT:  Head:  Normocephalic.  Right Ear: Tympanic membrane and external ear normal.  Left Ear: Tympanic membrane and external ear normal.  There is no swelling of the lips. The airway is patent. Uvula is in the midline. No swelling of the tongue, speech is understandable.  Eyes: EOM and lids are normal. Pupils are equal, round, and reactive to light.  Neck: Normal range of motion. Neck supple. Carotid bruit is not present.  Cardiovascular: Normal rate, regular rhythm, normal heart sounds, intact distal pulses and normal  pulses.   Pulmonary/Chest: Breath sounds normal. No respiratory distress. She has no wheezes. She has no rales.  Abdominal: Soft. Bowel sounds are normal. There is no tenderness. There is no guarding.  Musculoskeletal: Normal range of motion.  Pain of the right lateral malleolus with palpation and ROM. No swelling noted. DP and PT pulse wnl. Achilles intact on the right. No lesions between the toes. No puncture wounds.  Lymphadenopathy:       Head (right side): No submandibular adenopathy present.       Head (left side): No submandibular adenopathy present.    She has no cervical adenopathy.  Neurological: She is alert and oriented to person, place, and time. She has normal strength. No cranial nerve deficit or sensory deficit.  Skin: Skin is warm and dry. Rash noted.  Hives at multiple sites  Psychiatric: She has a normal mood and affect. Her speech is normal.  Nursing note and vitals reviewed.   ED Course  Procedures (including critical care time) Labs Review Labs Reviewed - No data to display  Imaging Review No results found.   EKG Interpretation None      MDM  Vital signs are within normal limts.    Pt reports minimal relief from benadryl, and pepcid. Vistaril ordered.  Xray of the right ankle is negative. ASO applied with improvement in the pain.  Patient reports significant improvement in the itching and some improvement in the hives themselves after intramuscular Vistaril.  The patient will be treated with an ankle stirrup splint, as well as Vistaril for the hives. Patient is given a referral to [facility] for allergy testing. Prescription for Vistaril given to the patient. Patient will return if any changes, problems, or concerns.    Final diagnoses:  Ankle pain, right  Ankle sprain, right, initial encounter  Hives    *I have reviewed nursing notes, vital signs, and all appropriate lab and imaging results for this patient.[address]    Cyprian Gongaware M  Laquesha Holcomb, PA-C 11/14/14 1143  Donnetta HutchingBrian Cook, MD 11/17/14 1357

## 2014-11-14 NOTE — ED Notes (Signed)
Med received from pharmacy

## 2014-11-14 NOTE — ED Notes (Signed)
Pt states right lateral ankle pain. Pt also states right eye was swollen shut when she woke up this morning. Swelling first noticed last night. "Bumps" to  Back and left hand.

## 2014-11-15 LAB — POC URINE PREG, ED: Preg Test, Ur: NEGATIVE

## 2014-12-20 ENCOUNTER — Encounter (HOSPITAL_COMMUNITY): Payer: Self-pay | Admitting: *Deleted

## 2014-12-20 ENCOUNTER — Emergency Department (HOSPITAL_COMMUNITY)
Admission: EM | Admit: 2014-12-20 | Discharge: 2014-12-21 | Disposition: A | Discharge status: Home / Self Care | Payer: Self-pay | Attending: Emergency Medicine | Admitting: Emergency Medicine

## 2014-12-20 ENCOUNTER — Emergency Department (HOSPITAL_COMMUNITY): Payer: Medicaid Other

## 2014-12-20 DIAGNOSIS — R1084 Generalized abdominal pain: Secondary | ICD-10-CM | POA: Insufficient documentation

## 2014-12-20 DIAGNOSIS — R109 Unspecified abdominal pain: Secondary | ICD-10-CM

## 2014-12-20 DIAGNOSIS — Z3202 Encounter for pregnancy test, result negative: Secondary | ICD-10-CM | POA: Insufficient documentation

## 2014-12-20 DIAGNOSIS — Z79899 Other long term (current) drug therapy: Secondary | ICD-10-CM | POA: Insufficient documentation

## 2014-12-20 DIAGNOSIS — R112 Nausea with vomiting, unspecified: Secondary | ICD-10-CM | POA: Insufficient documentation

## 2014-12-20 DIAGNOSIS — R42 Dizziness and giddiness: Secondary | ICD-10-CM | POA: Insufficient documentation

## 2014-12-20 DIAGNOSIS — R52 Pain, unspecified: Secondary | ICD-10-CM

## 2014-12-20 LAB — URINALYSIS, ROUTINE W REFLEX MICROSCOPIC
Bilirubin Urine: NEGATIVE
Glucose, UA: NEGATIVE mg/dL
Ketones, ur: NEGATIVE mg/dL
LEUKOCYTES UA: NEGATIVE
NITRITE: NEGATIVE
PH: 6.5 (ref 5.0–8.0)
SPECIFIC GRAVITY, URINE: 1.025 (ref 1.005–1.030)
Urobilinogen, UA: 1 mg/dL (ref 0.0–1.0)

## 2014-12-20 LAB — BASIC METABOLIC PANEL
Anion gap: 7 (ref 5–15)
BUN: 7 mg/dL (ref 6–23)
CALCIUM: 9.2 mg/dL (ref 8.4–10.5)
CO2: 27 mmol/L (ref 19–32)
Chloride: 104 mmol/L (ref 96–112)
Creatinine, Ser: 0.75 mg/dL (ref 0.50–1.10)
GLUCOSE: 70 mg/dL (ref 70–99)
Potassium: 3.3 mmol/L — ABNORMAL LOW (ref 3.5–5.1)
Sodium: 138 mmol/L (ref 135–145)

## 2014-12-20 LAB — HEPATIC FUNCTION PANEL
ALBUMIN: 3.9 g/dL (ref 3.5–5.2)
ALT: 10 U/L (ref 0–35)
AST: 15 U/L (ref 0–37)
Alkaline Phosphatase: 53 U/L (ref 39–117)
BILIRUBIN DIRECT: 0.1 mg/dL (ref 0.0–0.5)
BILIRUBIN INDIRECT: 0.5 mg/dL (ref 0.3–0.9)
TOTAL PROTEIN: 6.7 g/dL (ref 6.0–8.3)
Total Bilirubin: 0.6 mg/dL (ref 0.3–1.2)

## 2014-12-20 LAB — CBC WITH DIFFERENTIAL/PLATELET
BASOS PCT: 0 % (ref 0–1)
Basophils Absolute: 0 10*3/uL (ref 0.0–0.1)
Eosinophils Absolute: 0.1 10*3/uL (ref 0.0–0.7)
Eosinophils Relative: 2 % (ref 0–5)
HCT: 29.9 % — ABNORMAL LOW (ref 36.0–46.0)
Hemoglobin: 9.3 g/dL — ABNORMAL LOW (ref 12.0–15.0)
Lymphocytes Relative: 27 % (ref 12–46)
Lymphs Abs: 0.9 10*3/uL (ref 0.7–4.0)
MCH: 26.8 pg (ref 26.0–34.0)
MCHC: 31.1 g/dL (ref 30.0–36.0)
MCV: 86.2 fL (ref 78.0–100.0)
Monocytes Absolute: 0.3 10*3/uL (ref 0.1–1.0)
Monocytes Relative: 8 % (ref 3–12)
Neutro Abs: 2 10*3/uL (ref 1.7–7.7)
Neutrophils Relative %: 63 % (ref 43–77)
PLATELETS: 153 10*3/uL (ref 150–400)
RBC: 3.47 MIL/uL — AB (ref 3.87–5.11)
RDW: 12.3 % (ref 11.5–15.5)
WBC: 3.3 10*3/uL — ABNORMAL LOW (ref 4.0–10.5)

## 2014-12-20 LAB — POC URINE PREG, ED: Preg Test, Ur: NEGATIVE

## 2014-12-20 LAB — LIPASE, BLOOD: Lipase: 18 U/L (ref 11–59)

## 2014-12-20 LAB — URINE MICROSCOPIC-ADD ON

## 2014-12-20 MED ORDER — KETOROLAC TROMETHAMINE 30 MG/ML IJ SOLN
30.0000 mg | Freq: Once | INTRAMUSCULAR | Status: AC
  Administered 2014-12-20: 30 mg via INTRAVENOUS
  Filled 2014-12-20: qty 1

## 2014-12-20 MED ORDER — DICYCLOMINE HCL 20 MG PO TABS: ORAL_TABLET | ORAL | Status: AC

## 2014-12-20 MED ORDER — SODIUM CHLORIDE 0.9 % IV BOLUS (SEPSIS)
1000.0000 mL | Freq: Once | INTRAVENOUS | Status: AC
  Administered 2014-12-20: 1000 mL via INTRAVENOUS

## 2014-12-20 MED ORDER — ONDANSETRON HCL 4 MG/2ML IJ SOLN
4.0000 mg | Freq: Once | INTRAMUSCULAR | Status: AC
  Administered 2014-12-20: 4 mg via INTRAVENOUS
  Filled 2014-12-20: qty 2

## 2014-12-20 NOTE — ED Provider Notes (Signed)
CSN: [id number]     Arrival date & time 12/20/14  1840 History  This chart was scribed for Benny LennertJoseph L Leahann Lempke, MD by Gwenyth Oberatherine Macek, ED Scribe. This patient was seen in room [facility]11/[facility]11 and the patient's care was started at 9:19 PM.    Chief Complaint  Patient presents with  . Abdominal Pain   Patient is a 24 y.o. female presenting with abdominal pain. The history is provided by the patient. No language interpreter was used.  Abdominal Pain Pain location:  Generalized Pain quality: pressure   Pain radiates to:  Does not radiate Pain severity:  Moderate Onset quality:  Gradual Duration:  1 week Timing:  Constant Progression:  Unchanged Chronicity:  New Associated symptoms: nausea and vomiting   Associated symptoms: no chest pain, no cough, no diarrhea, no fatigue, no hematemesis and no hematuria     HPI Comments: [name] is a 24 y.o. female who presents to the Emergency Department complaining of constant, pressure-like abdominal pain that started 1 week ago. She states 6 episdoes of vomiting, intermittent HA, and light-headedness as associated symptoms. Pt denies diarrhea and hematemesis. She has no history of abdominal surgery.   Pt also complains of rash on right upper chest and back. She reports itchiness as an associated symptom. Pt states history of similar rash when she was bit by an insect.  Past Medical History  Diagnosis Date  . Hx of ventricular shunt   . Cerebral ventricular shunt fitting or adjustment    Past Surgical History  Procedure Laterality Date  . Ventricular ablation surgery     History reviewed. No pertinent family history. History  Substance Use Topics  . Smoking status: Smoker, Current Status Unknown    Types: Cigarettes  . Smokeless tobacco: Not on file  . Alcohol Use: No     Comment: occasional   OB History    No data available     Review of Systems  Constitutional: Negative for appetite change and fatigue.  HENT: Negative for  congestion, ear discharge and sinus pressure.   Eyes: Negative for discharge.  Respiratory: Negative for cough.   Cardiovascular: Negative for chest pain.  Gastrointestinal: Positive for nausea, vomiting and abdominal pain. Negative for diarrhea and hematemesis.  Genitourinary: Negative for frequency and hematuria.  Musculoskeletal: Negative for back pain.  Skin: Positive for rash.  Neurological: Positive for light-headedness and headaches. Negative for seizures.  Psychiatric/Behavioral: Negative for hallucinations.    Allergies  Bee venom and Other  Home Medications   Prior to Admission medications   Medication Sig Start Date End Date Taking? Authorizing Provider  fexofenadine (ALLEGRA) 180 MG tablet 1 tab po q AM for itching Patient not taking: Reported on 11/14/2014 06/08/11   Kathie DikeHobson M Bryant, PA-C  hydrOXYzine (ATARAX/VISTARIL) 25 MG tablet Take 1 tablet (25 mg total) by mouth every 6 (six) hours. Patient not taking: Reported on 12/20/2014 11/14/14   Kathie DikeHobson M Bryant, PA-C  hydrOXYzine (VISTARIL) 100 MG capsule 1 at hs for itching. Patient not taking: Reported on 11/14/2014 06/08/11   Kathie DikeHobson M Bryant, PA-C   BP 101/74 mmHg  Pulse 78  Temp(Src) 98.9 F (37.2 C) (Oral)  Resp 18  Ht 5\' 1"  (1.549 m)  Wt 160 lb (72.576 kg)  BMI 30.25 kg/m2  SpO2 98%  LMP 11/09/2014 Physical Exam  Constitutional: She is oriented to person, place, and time. She appears well-developed.  HENT:  Head: Normocephalic.  Eyes: Conjunctivae and EOM are normal. No scleral icterus.  Neck:  Neck supple. No thyromegaly present.  Cardiovascular: Normal rate and regular rhythm.  Exam reveals no gallop and no friction rub.   No murmur heard. Pulmonary/Chest: No stridor. She has no wheezes. She has no rales. She exhibits no tenderness.  Abdominal: She exhibits no distension. There is tenderness. There is no rebound.  Mild diffuse abdominal tenderness  Musculoskeletal: Normal range of motion. She exhibits no  edema.  Lymphadenopathy:    She has no cervical adenopathy.  Neurological: She is oriented to person, place, and time. She exhibits normal muscle tone. Coordination normal.  Skin: Rash noted. No erythema.  4 little rash areas: 2 on her upper back and 2 on her upper chest that are 0.5 cm in diameter  Psychiatric: She has a normal mood and affect. Her behavior is normal.  Nursing note and vitals reviewed.   ED Course  Procedures (including critical care time) DIAGNOSTIC STUDIES: Oxygen Saturation is 98% on RA, normal by my interpretation.    COORDINATION OF CARE: 9:23 PM Discussed treatment plan with pt which includes lab work. Pt agreed to plan.   Labs Review Labs Reviewed  CBC WITH DIFFERENTIAL/PLATELET  BASIC METABOLIC PANEL  POC URINE PREG, ED    Imaging Review No results found.   EKG Interpretation None      MDM   Final diagnoses:  None    abd cramps,   tx with bentyl and follow up  The chart was scribed for me under my direct supervision.  I personally performed the history, physical, and medical decision making and all procedures in the evaluation of this patient.Benny Lennert, MD 12/20/14 [phone]

## 2014-12-20 NOTE — ED Notes (Signed)
This nurse asked pt what we could help her with tonight, and she huffed a couple of times and said "oh my god, not again"  When I explained to the patient that I was just asking so I could determine if she needed to be in a gown the patient snapped back at me "excuse me, I have already said what I am here for"   This nurse laid a gown on the stretcher and asked the patient if she could please place the gown on and the doctor and her nurse would be in shortly.

## 2014-12-20 NOTE — Discharge Instructions (Signed)
Follow up with dr. Emelda FearFerguson if not improving

## 2014-12-20 NOTE — ED Notes (Signed)
abd pain, N/V no diarrhea.  Vaginal d/c  X 1 week

## 2014-12-21 ENCOUNTER — Ambulatory Visit: Payer: Self-pay | Admitting: Advanced Practice Midwife

## 2014-12-21 ENCOUNTER — Encounter: Payer: Self-pay | Admitting: *Deleted

## 2015-01-26 ENCOUNTER — Emergency Department (HOSPITAL_COMMUNITY): Payer: Medicaid Other

## 2015-01-26 ENCOUNTER — Encounter (HOSPITAL_COMMUNITY): Payer: Self-pay | Admitting: Emergency Medicine

## 2015-01-26 ENCOUNTER — Emergency Department (HOSPITAL_COMMUNITY)
Admission: EM | Admit: 2015-01-26 | Discharge: 2015-01-26 | Disposition: A | Discharge status: Home / Self Care | Payer: Self-pay | Attending: Emergency Medicine | Admitting: Emergency Medicine

## 2015-01-26 DIAGNOSIS — Z720 Tobacco use: Secondary | ICD-10-CM | POA: Insufficient documentation

## 2015-01-26 DIAGNOSIS — J020 Streptococcal pharyngitis: Secondary | ICD-10-CM | POA: Insufficient documentation

## 2015-01-26 DIAGNOSIS — R51 Headache: Secondary | ICD-10-CM | POA: Insufficient documentation

## 2015-01-26 DIAGNOSIS — Z79899 Other long term (current) drug therapy: Secondary | ICD-10-CM | POA: Insufficient documentation

## 2015-01-26 LAB — RAPID STREP SCREEN (MED CTR MEBANE ONLY): Streptococcus, Group A Screen (Direct): POSITIVE — AB

## 2015-01-26 MED ORDER — HYDROCODONE-ACETAMINOPHEN 7.5-325 MG/15ML PO SOLN: 15.0000 mL | Freq: Four times a day (QID) | ORAL | Status: AC | PRN

## 2015-01-26 MED ORDER — OXYCODONE-ACETAMINOPHEN 5-325 MG PO TABS
2.0000 | ORAL_TABLET | Freq: Once | ORAL | Status: AC
  Administered 2015-01-26: 2 via ORAL
  Filled 2015-01-26: qty 2

## 2015-01-26 MED ORDER — PENICILLIN G BENZATHINE 1200000 UNIT/2ML IM SUSP
1.2000 10*6.[IU] | Freq: Once | INTRAMUSCULAR | Status: AC
  Administered 2015-01-26: 1.2 10*6.[IU] via INTRAMUSCULAR
  Filled 2015-01-26: qty 2

## 2015-01-26 MED ORDER — OXYCODONE-ACETAMINOPHEN 5-325 MG PO TABS: 2.0000 | ORAL_TABLET | Freq: Once | ORAL | Status: AC

## 2015-01-26 NOTE — ED Notes (Signed)
Pt reports headache that started this am. No emesis. Pt c/o sore throat.

## 2015-01-26 NOTE — Discharge Instructions (Signed)
°Emergency Department Resource Guide °1) Find a Doctor and Pay Out of Pocket °Although you won't have to find out who is covered by your insurance plan, it is a good idea to ask around and get recommendations. You will then need to call the office and see if the doctor you have chosen will accept you as a new patient and what types of options they offer for patients who are self-pay. Some doctors offer discounts or will set up payment plans for their patients who do not have insurance, but you will need to ask so you aren't surprised when you get to your appointment. ° °2) Contact Your Local Health Department °Not all health departments have doctors that can see patients for sick visits, but many do, so it is worth a call to see if yours does. If you don't know where your local health department is, you can check in your phone book. The CDC also has a tool to help you locate your state's health department, and many state websites also have listings of all of their local health departments. ° °3) Find a Walk-in Clinic °If your illness is not likely to be very severe or complicated, you may want to try a walk in clinic. These are popping up all over the country in pharmacies, drugstores, and shopping centers. They're usually staffed by nurse practitioners or physician assistants that have been trained to treat common illnesses and complaints. They're usually fairly quick and inexpensive. However, if you have serious medical issues or chronic medical problems, these are probably not your best option. ° °No Primary Care Doctor: °- Call [facility] at  832-8000 - they can help you locate a primary care doctor that  accepts your insurance, provides certain services, etc. °- Physician Referral Service- 1-800-533-3463 ° °Chronic Pain Problems: °Organization         Address  Phone   Notes  °[facility]  (336) 297-2271 Patients need to be referred by their primary care doctor.  ° °Medication  Assistance: °Organization         Address  Phone   Notes  °[place] [facility] [address] °[place], [place] [address] (336) 641-8030 --Must be a resident of [place] °-- Must have NO insurance coverage whatsoever (no Medicaid/ Medicare, etc.) °-- The pt. MUST have a primary care doctor that directs their care regularly and follows them in the community °  °[facility]  (866) 331-1348   °[employer]  (888) 892-1162   ° °Agencies that provide inexpensive medical care: °Organization         Address  Phone   Notes  °[facility]  (336) 832-8035   °[facility]    (336) 832-7272   °[facility] [address] °[place], [place] [address] (336) 832-4777   °[facility] [address], °[place] (336) 271-4999   °[facility]    (336) 373-0678   °[facility]    (336) 272-1050   °[facility] ° [address], [place] Phone:  (336) 832-4444, Fax:  (336) 832-4440 Hours of Operation:  9 am - 6 pm, M-F.  Also accepts Medicaid/Medicare and self-pay.  °[facility] ° [address], [place] Phone: (336) 832-3150, Fax: (336) [phone]. Hours of Operation:  8:30 am - 5:30 pm, M-F.  Also accepts Medicaid and self-pay.  °[facility] [place] [address], [place] Phone: (336) 878-6027   °[facility] [address], [place], [place] (336)723-1848, Ext. 123 Mondays & Thursdays: 7-9 AM.  First 15 patients are seen on a first come, first serve basis. °  ° °Medicaid-accepting [place] Providers: ° °Organization         Address  Phone   Notes  °[facility] [address], [place] (336) 641-2100 Also accepts self-pay patients.  °[facility] [address], [place] ° (336) 856-9996   °[facility] [address], [place]  (336) 288-8857   °[facility] [address] [place] [address], [place] (336) 299-7000   °Veita Bland [address], [place]  ° (336) 373-1557 Only accepts [place] Access Medicaid patients after they have their name applied to their card.  ° °Self-Pay (no insurance) in [place]: ° °Organization         Address  Phone   Notes  °Sickle Cell Patients, [facility] [address], [place] (336) 832-1970   °[facility] [address], [place] (336) 832-4400   °[facility] ° [address], [place] (336) 992-4800   °[facility]/Dr. Osei-Bonsu ° [address] [place] [address], [place] or [address], [place] (336) [phone] Phone number for both [place] and [place] locations is the same.  °[facility] [address], [place] (336) 299-0000   °[facility] [address] [place] [address], [place] or [address] (336) 852-7530 °(336) 878-2260   °[facility] [address], [place] (336) 350-1642, phone; (336) 294-5005, fax Sees patients 1st and 3rd Saturday of every month.  Must not qualify for public or private insurance (i.e. Medicaid, Medicare, [place] Health Choice, Veterans' Benefits) • Household income should be no more than 200% of the poverty level •The clinic cannot treat you if you are pregnant or think you are pregnant • Sexually transmitted diseases are not treated at the clinic.  ° ° °Dental Care: °Organization         Address  Phone  Notes  °[place] [facility] [address], [place] (336) 641-6152 Accepts children up to age 21 who are enrolled in Medicaid or [place] Health Choice; pregnant women with a Medicaid card; and children who have applied for Medicaid or [place] Health Choice, but were declined, whose parents can pay a reduced fee at time of service.  °[place]  [facility] [place]  [address], [place] (336) 641-7733 Accepts children up to age 21 who are enrolled in Medicaid or [place] Health Choice; pregnant women with a Medicaid card; and children who have applied for Medicaid or [place] Health Choice, but were declined, whose parents can pay a reduced fee at time of service.  °[facility] ° [address], [place] (336) 641-4533 Patients are seen by appointment only. Walk-ins are not accepted. [facility] will see patients 18 years of age and older. °Monday - Tuesday (8am-5pm) °Most Wednesdays (8:30-5pm) °$30 per visit, cash only  °[facility] ° [address], [place] (336) 641-4533 Patients are seen by appointment only. Walk-ins are not accepted. [facility] will see patients 18 years of age and older. °One   Wednesday Evening (Monthly: Volunteer Based).  $30 per visit, cash only  °[facility]  (919) 537-3737 for adults; Children under age 4, call Graduate Pediatric Dentistry at ([phone]) [phone]. Children aged 4-14, please call ([phone]) [phone] to request a pediatric application. ° Dental services are provided in all areas of dental care including fillings, crowns and bridges, complete and partial dentures, implants, gum treatment, root canals, and extractions. Preventive care is also provided. Treatment is provided to both adults and children. °Patients are selected via a lottery and there is often a waiting list. °  °[facility] [address], °[place] ° (336) 763-8833 [email or web address] °  °[facility] [address], [place], [place] (336)723-1848, Ext. 123 Second and Fourth Thursday of each month, opens at 6:30 AM; Clinic ends at 9 AM.  Patients are seen on a first-come first-served basis, and a limited number are seen during each clinic.  ° °[facility] ° [address], [place], [place] (336) 723-7904    Eligibility Requirements °You must have lived in [place], [place], or [place] counties for at least the last three months. °  You cannot be eligible for state or federal sponsored healthcare insurance, including Veterans Administration, Medicaid, or Medicare. °  You generally cannot be eligible for healthcare insurance through your employer.  °  How to apply: °Eligibility screenings are held every Tuesday and Wednesday afternoon from 1:00 pm until 4:00 pm. You do not need an appointment for the interview!  °[facility] [address], [place], [place] 336-631-2330   °[facility]  336-342-8273   °[facility]  336-703-3100   °[facility]  336-570-6415   ° °Behavioral Health Resources in the Community: °Intensive Outpatient Programs °Organization         Address  Phone  Notes  °[facility] [address], [place], [place] 336-878-6098   °[facility] [address], [place], [place] 336-832-9800   °[facility] [address], [place], [place] ° 336-882-2125   °[facility] [address],  °[place], [place] 1-800-853-5163 or 336-641-4981   °Substance Abuse Resources °Organization         Address  Phone  Notes  °[facility]  336-882-2125   °[facility]  336-784-9470   °[facility]  336-285-9073   °[facility]  336-845-3988   °Residential & Outpatient Substance Abuse Program  1-800-659-3381   °Psychological Services °Organization         Address  Phone  Notes  °[facility]  336- 832-9600   °[facility]  336- 378-7881   °[facility] [address], [place] 1-800-853-5163 or 336-641-4981   ° °Mobile Crisis Teams °Organization         Address  Phone  Notes  °[facility], Mobile Crisis Care Unit  1-877-626-1772   °Assertive °[facility] ° [address]  [place], [place] 336-834-9664   °Sharon DeEsch [address] °[place] 336-554-5454   ° °Self-Help/Support Groups °Organization         Address  Phone             Notes  °[facility]  - variety of support groups  336- 373-1402 Call for more information  °Narcotics Anonymous (NA), [facility] [address], °[place]  2 meetings at this location  ° °  Residential Treatment Programs Organization         Address  Phone  Notes  [facility] [address],    [place]  [phone]   [facility]  [address], [place] [phone], [place], [place] [phone]   [facility] [address], [place] [phone] Admissions: 8am-3pm M-F  [facility] [address]    [place], [place] [phone]   [facility] [address], [place], [place] [phone]   [facility] [address]  [place], [place] [phone])[phone]   [facility] Intensive Outpatient [address], [place], [place] [phone]   [facility] ([facility] [address]  [place], [place] [phone] or [phone]   [facility] ([facility] [address] [place], [place] [phone] Accepts Medicaid  [facility] [address]  [place] [phone] Substance Abuse/Addiction Treatment   [facility]         Address  Phone  Notes  [facility]  360-142-1647(888) (785)[phone]   Angie FavaJulie Brannon, PhD [address], [address] [place], [place]   ([phone](336) [phone] or (424) [phone](336) [phone]   [facility]   [address] [place], [place] (604) [phone](336) [phone]   [facility] [address], [place], [place] [phone]([phone]) [phone] Insurance/Medicaid/sponsorship through [facility] [address]                                    [place], [place] [phone]) [phone](336) [phone] Therapy/tele-psych/case    [facility] [address]   [place], [place] [phone](336) [phone]    Dr. Lolly MustacheArfeen  [phone]) [phone]   [facility]  [employer] [facility] 1) [address], [place] 2) [address], [place] 3)  [address], [place] [phone]) [phone](336) [phone]  [phone](336) [phone]   [facility] Child [facility] (747)432-5610(336) 406-441-8919 or (563)[phone](336) (636)644-1742 (After Hours)      Take over the counter tylenol and ibuprofen, as directed on packaging, as needed for discomfort. Do not take extra tylenol if you take the prescription pain medication because it already contains tylenol. Gargle with warm water several times per day to help with discomfort.  May also use over the counter sore throat pain medicines such as chloraseptic or sucrets, as directed on packaging, as needed for discomfort.  Call your regular medical doctor today to schedule a follow up appointment in the next 2 days.  Return to the Emergency Department immediately if worsening.

## 2015-01-26 NOTE — ED Provider Notes (Signed)
CSN: [id number]     Arrival date & time 01/26/15  1223 History   First MD Initiated Contact with Patient 01/26/15 1428     Chief Complaint  Patient presents with  . Migraine  . Sore Throat    HPI Pt was seen at 1445.  Per pt, c/o gradual onset and persistence of constant sore throat, runny/stuffy nose, sinus congestion since last night. Has been associated with frontal headache.  Denies neck pain, no fevers, no rash, no CP/SOB, no N/V/D, no abd pain.  Denies headache was sudden or maximal in onset or at any time.  Denies visual changes, no focal motor weakness, no tingling/numbness in extremities.      Past Medical History  Diagnosis Date  . Hx of ventricular shunt   . Cerebral ventricular shunt fitting or adjustment    Past Surgical History  Procedure Laterality Date  . Ventricular ablation surgery     Family History  Problem Relation Age of Onset  . Diabetes Other    History  Substance Use Topics  . Smoking status: Smoker, Current Status Unknown    Types: Cigarettes  . Smokeless tobacco: Not on file  . Alcohol Use: No     Comment: occasional    Review of Systems ROS: Statement: All systems negative except as marked or noted in the HPI; Constitutional: Negative for fever and chills. ; ; Eyes: Negative for eye pain, redness and discharge. ; ; ENMT: Negative for ear pain, hoarseness, +nasal congestion, sinus pressure and sore throat. ; ; Cardiovascular: Negative for chest pain, palpitations, diaphoresis, dyspnea and peripheral edema. ; ; Respiratory: Negative for cough, wheezing and stridor. ; ; Gastrointestinal: Negative for nausea, vomiting, diarrhea, abdominal pain, blood in stool, hematemesis, jaundice and rectal bleeding. . ; ; Genitourinary: Negative for dysuria, flank pain and hematuria. ; ; Musculoskeletal: Negative for back pain and neck pain. Negative for swelling and trauma.; ; Skin: Negative for pruritus, rash, abrasions, blisters, bruising and skin lesion.; ; Neuro:  +frontal headache. Negative for lightheadedness and neck stiffness. Negative for weakness, altered level of consciousness , altered mental status, extremity weakness, paresthesias, involuntary movement, seizure and syncope.     Allergies  Bee venom and Other  Home Medications   Prior to Admission medications   Medication Sig Start Date End Date Taking? Authorizing Provider  dicyclomine (BENTYL) 20 MG tablet Take one pill every 6 hours for abd cramps Patient not taking: Reported on 01/26/2015 12/20/14   Benny LennertJoseph L Zammit, MD  fexofenadine (ALLEGRA) 180 MG tablet 1 tab po q AM for itching Patient not taking: Reported on 11/14/2014 06/08/11   Kathie DikeHobson M Bryant, PA-C   BP 121/82 mmHg  Pulse 113  Temp(Src) 100.4 F (38 C) (Oral)  Resp 16  Ht 5\' 1"  (1.549 m)  Wt 160 lb (72.576 kg)  BMI 30.25 kg/m2  SpO2 100%  LMP 01/20/2015 Physical Exam  1450: Physical examination:  Nursing notes reviewed; Vital signs and O2 SAT reviewed;  Constitutional: Well developed, Well nourished, Well hydrated, Tearful.;; Head:  Normocephalic, atraumatic; Eyes: EOMI, PERRL, No scleral icterus; ENMT: TM's clear bilat. +edemetous nasal turbinates bilat with clear rhinorrhea. Mouth and pharynx without lesions. No tonsillar exudates. No intra-oral edema. +mild posterior pharyngeal erythema. No submandibular or sublingual edema. No hoarse voice, no drooling, no stridor. No pain with manipulation of larynx. No trismus. Mouth and pharynx normal, Mucous membranes moist; Neck: Supple, Full range of motion, No lymphadenopathy. No meningeal signs.; Cardiovascular: Tachycardic rate and rhythm, No murmur, rub,  or gallop; Respiratory: Breath sounds clear & equal bilaterally, No rales, rhonchi, wheezes.  Speaking full sentences with ease, Normal respiratory effort/excursion; Chest: Nontender, Movement normal; Abdomen: Soft, Nontender, Nondistended, Normal bowel sounds; Genitourinary: No CVA tenderness; Extremities: Pulses normal, No  tenderness, No edema, No calf edema or asymmetry.; Neuro: AA&Ox3, Major CN grossly intact. No facial droop. Speech clear. No gross focal motor or sensory deficits in extremities. Climbs on and off stretcher easily by herself. Gait steady.; Skin: Color normal, Warm, Dry.; Psych:  Crying.    ED Course  Procedures     EKG Interpretation None      MDM  MDM Reviewed: previous chart, nursing note and vitals Interpretation: CT scan, x-ray and labs   Results for orders placed or performed during the hospital encounter of 01/26/15  Rapid strep screen  Result Value Ref Range   Streptococcus, Group A Screen (Direct) POSITIVE (A) NEGATIVE    Ct Head Wo Contrast 01/26/2015   CLINICAL DATA:  Severe headache with onset of symptoms this morning. Initial encounter. Patient with ventriculoperitoneal shunt. Migraine headache.  EXAM: CT HEAD WITHOUT CONTRAST  TECHNIQUE: Contiguous axial images were obtained from the base of the skull through the vertex without intravenous contrast.  COMPARISON:  04/14/2011.  FINDINGS: No mass lesion, midline shift, hydrocephalus, or hemorrhage. No acute infarct is identified. 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 RIGHT parietal ventriculostomy is present with the tip terminating near the foramen of Monro. There is no hydrocephalus. Ventricular size and configuration is unchanged compared to prior CT of 04/14/2011. The calvarium is intact. There is no discontinuity of the shunt identified. Visible paranasal sinuses appear within normal limits.  IMPRESSION: 1. No acute abnormality or interval change. 2. Uncomplicated RIGHT parietal shunt.  Negative for hydrocephalus.   Electronically Signed   By: Andreas Newport M.D.   On: 01/26/2015 15:42   Dg Abd Acute W/chest 01/26/2015   CLINICAL DATA:  Hydrocephalus. Stomach pain. Initial encounter. Ventriculoperitoneal shunt.  EXAM: ACUTE ABDOMEN SERIES (ABDOMEN 2 VIEW & CHEST 1 VIEW)  COMPARISON:   12/20/2014.  FINDINGS: The visible shunt tubing is contiguous. No active cardiopulmonary disease. No free air underneath the hemidiaphragms. There is no mass lesion identified in the abdomen or pelvis. There is a broken fragment of shunt tubing which is slightly changed in position in the RIGHT anatomic pelvis. The bowel gas pattern is normal. No pathologic air-fluid levels or bowel dilation. Bones appear within normal limits.  IMPRESSION: 1. Normal bowel gas pattern. 2. Shunt tubing appears contiguous through the chest and abdomen. Small shunt fragment in the RIGHT anatomic pelvis has slightly shifted position compared to prior.   Electronically Signed   By: Andreas Newport M.D.   On: 01/26/2015 15:52      1615:  Pt crying on arrival to ED: when asked why, she states "I've never had my throat hurt this bad before," c/o sore throat and frontal headache to me. Triage RN states pt told her that the right side of her head hurt. Shunt series obtained and was reassuring.  IM PCN given for strep throat as well as pain meds. Pt sleeping soundly on stretcher, easily arousable to name. States she feels better now and wants to go home. No longer tearful. Neuro exam remains intact and unchanged. Dx and testing d/w pt.  Questions answered.  Verb understanding, agreeable to d/c home with outpt f/u.    Samuel Jester, DO 01/28/15 2151

## 2021-06-26 DEATH — deceased
